# Patient Record
Sex: Male | Born: 1975 | Race: White | Marital: Married | Smoking: Current every day smoker
Health system: Southern US, Community
[De-identification: ages and names within clinical notes are randomized; demographics above are authoritative.]

## PROBLEM LIST (undated history)

## (undated) DIAGNOSIS — E119 Type 2 diabetes mellitus without complications: Secondary | ICD-10-CM

---

## 2020-03-20 ENCOUNTER — Emergency Department: Payer: No Typology Code available for payment source

## 2020-03-20 ENCOUNTER — Emergency Department
Admission: EM | Admit: 2020-03-20 | Discharge: 2020-03-23 | Disposition: E | Payer: No Typology Code available for payment source | Attending: Emergency Medicine | Admitting: Emergency Medicine

## 2020-03-20 ENCOUNTER — Other Ambulatory Visit: Payer: Self-pay

## 2020-03-20 DIAGNOSIS — U071 COVID-19: Secondary | ICD-10-CM | POA: Diagnosis not present

## 2020-03-20 DIAGNOSIS — A419 Sepsis, unspecified organism: Secondary | ICD-10-CM | POA: Insufficient documentation

## 2020-03-20 DIAGNOSIS — E872 Acidosis, unspecified: Secondary | ICD-10-CM

## 2020-03-20 DIAGNOSIS — E119 Type 2 diabetes mellitus without complications: Secondary | ICD-10-CM | POA: Diagnosis not present

## 2020-03-20 DIAGNOSIS — Z7984 Long term (current) use of oral hypoglycemic drugs: Secondary | ICD-10-CM | POA: Diagnosis not present

## 2020-03-20 DIAGNOSIS — J9601 Acute respiratory failure with hypoxia: Secondary | ICD-10-CM

## 2020-03-20 DIAGNOSIS — R6520 Severe sepsis without septic shock: Secondary | ICD-10-CM | POA: Diagnosis not present

## 2020-03-20 DIAGNOSIS — R0602 Shortness of breath: Secondary | ICD-10-CM | POA: Diagnosis present

## 2020-03-20 DIAGNOSIS — R739 Hyperglycemia, unspecified: Secondary | ICD-10-CM

## 2020-03-20 DIAGNOSIS — J9600 Acute respiratory failure, unspecified whether with hypoxia or hypercapnia: Secondary | ICD-10-CM | POA: Diagnosis not present

## 2020-03-20 DIAGNOSIS — I469 Cardiac arrest, cause unspecified: Secondary | ICD-10-CM | POA: Diagnosis not present

## 2020-03-20 DIAGNOSIS — F17200 Nicotine dependence, unspecified, uncomplicated: Secondary | ICD-10-CM | POA: Diagnosis not present

## 2020-03-20 HISTORY — DX: Type 2 diabetes mellitus without complications: E11.9

## 2020-03-20 LAB — BLOOD GAS, VENOUS
Acid-base deficit: 5.5 mmol/L — ABNORMAL HIGH (ref 0.0–2.0)
Bicarbonate: 18 mmol/L — ABNORMAL LOW (ref 20.0–28.0)
O2 Saturation: 99.6 %
Patient temperature: 37
pCO2, Ven: 29 mmHg — ABNORMAL LOW (ref 44.0–60.0)
pH, Ven: 7.4 (ref 7.250–7.430)
pO2, Ven: 176 mmHg — ABNORMAL HIGH (ref 32.0–45.0)

## 2020-03-20 LAB — BRAIN NATRIURETIC PEPTIDE: B Natriuretic Peptide: 94.6 pg/mL (ref 0.0–100.0)

## 2020-03-20 LAB — COMPREHENSIVE METABOLIC PANEL
ALT: 61 U/L — ABNORMAL HIGH (ref 0–44)
AST: 65 U/L — ABNORMAL HIGH (ref 15–41)
Albumin: 3.3 g/dL — ABNORMAL LOW (ref 3.5–5.0)
Alkaline Phosphatase: 136 U/L — ABNORMAL HIGH (ref 38–126)
Anion gap: 25 — ABNORMAL HIGH (ref 5–15)
BUN: 23 mg/dL — ABNORMAL HIGH (ref 6–20)
CO2: 9 mmol/L — ABNORMAL LOW (ref 22–32)
Calcium: 9.1 mg/dL (ref 8.9–10.3)
Chloride: 103 mmol/L (ref 98–111)
Creatinine, Ser: 1.26 mg/dL — ABNORMAL HIGH (ref 0.61–1.24)
GFR calc Af Amer: >60 mL/min (ref >60)
GFR calc non Af Amer: >60 mL/min (ref >60)
Glucose, Bld: 336 mg/dL — ABNORMAL HIGH (ref 70–99)
Potassium: 3.6 mmol/L (ref 3.5–5.1)
Sodium: 137 mmol/L (ref 135–145)
Total Bilirubin: 1.9 mg/dL — ABNORMAL HIGH (ref 0.3–1.2)
Total Protein: 8.6 g/dL — ABNORMAL HIGH (ref 6.5–8.1)

## 2020-03-20 LAB — CBC WITH DIFFERENTIAL/PLATELET
Abs Immature Granulocytes: 0.25 10*3/uL — ABNORMAL HIGH (ref 0.00–0.07)
Basophils Absolute: 0.1 10*3/uL (ref 0.0–0.1)
Basophils Relative: 0 %
Eosinophils Absolute: 0 10*3/uL (ref 0.0–0.5)
Eosinophils Relative: 0 %
HCT: 52.9 % — ABNORMAL HIGH (ref 39.0–52.0)
Hemoglobin: 17.9 g/dL — ABNORMAL HIGH (ref 13.0–17.0)
Immature Granulocytes: 1 %
Lymphocytes Relative: 9 %
Lymphs Abs: 2 10*3/uL (ref 0.7–4.0)
MCH: 30.9 pg (ref 26.0–34.0)
MCHC: 33.8 g/dL (ref 30.0–36.0)
MCV: 91.4 fL (ref 80.0–100.0)
Monocytes Absolute: 1.1 10*3/uL — ABNORMAL HIGH (ref 0.1–1.0)
Monocytes Relative: 5 %
Neutro Abs: 18.2 10*3/uL — ABNORMAL HIGH (ref 1.7–7.7)
Neutrophils Relative %: 85 %
Platelets: 201 10*3/uL (ref 150–400)
RBC: 5.79 MIL/uL (ref 4.22–5.81)
RDW: 12 % (ref 11.5–15.5)
Smear Review: NORMAL
WBC: 21.6 10*3/uL — ABNORMAL HIGH (ref 4.0–10.5)
nRBC: 0 % (ref 0.0–0.2)

## 2020-03-20 LAB — MAGNESIUM: Magnesium: 2.7 mg/dL — ABNORMAL HIGH (ref 1.7–2.4)

## 2020-03-20 LAB — GLUCOSE, CAPILLARY: Glucose-Capillary: 220 mg/dL — ABNORMAL HIGH (ref 70–99)

## 2020-03-20 LAB — TROPONIN I (HIGH SENSITIVITY): Troponin I (High Sensitivity): 19 ng/L — ABNORMAL HIGH (ref <18)

## 2020-03-20 LAB — LACTIC ACID, PLASMA: Lactic Acid, Venous: 5.7 mmol/L (ref 0.5–1.9)

## 2020-03-20 MED ORDER — LACTATED RINGERS IV SOLN: INTRAVENOUS | Status: DC

## 2020-03-20 MED ORDER — LORAZEPAM 2 MG/ML IJ SOLN
INTRAMUSCULAR | Status: AC
  Administered 2020-03-20: 0.5 mg via INTRAVENOUS
  Filled 2020-03-20: qty 1

## 2020-03-20 MED ORDER — EPINEPHRINE 1 MG/10ML IJ SOSY
PREFILLED_SYRINGE | INTRAMUSCULAR | Status: AC | PRN
  Administered 2020-03-20 (×6): 1 via INTRAVENOUS

## 2020-03-20 MED ORDER — SODIUM BICARBONATE 8.4 % IV SOLN
INTRAVENOUS | Status: AC | PRN
  Administered 2020-03-20 (×4): 50 meq via INTRAVENOUS

## 2020-03-20 MED ORDER — DEXAMETHASONE SODIUM PHOSPHATE 10 MG/ML IJ SOLN
6.0000 mg | Freq: Once | INTRAMUSCULAR | Status: AC
  Administered 2020-03-20: 6 mg via INTRAVENOUS
  Filled 2020-03-20: qty 1

## 2020-03-20 MED ORDER — IOHEXOL 350 MG/ML SOLN
100.0000 mL | Freq: Once | INTRAVENOUS | Status: AC | PRN
  Administered 2020-03-20: 100 mL via INTRAVENOUS

## 2020-03-20 MED ORDER — LORAZEPAM 2 MG/ML IJ SOLN: 0.5000 mg | Freq: Once | INTRAMUSCULAR | Status: AC

## 2020-03-20 MED ORDER — INSULIN REGULAR(HUMAN) IN NACL 100-0.9 UT/100ML-% IV SOLN: INTRAVENOUS | Status: DC

## 2020-03-20 MED ORDER — ETOMIDATE 2 MG/ML IV SOLN
INTRAVENOUS | Status: AC | PRN
  Administered 2020-03-20: 30 mg via INTRAVENOUS

## 2020-03-20 MED ORDER — LACTATED RINGERS IV BOLUS: 1000.0000 mL | Freq: Once | INTRAVENOUS | Status: DC

## 2020-03-20 MED ORDER — DEXTROSE IN LACTATED RINGERS 5 % IV SOLN: INTRAVENOUS | Status: DC

## 2020-03-20 MED ORDER — LACTATED RINGERS IV BOLUS
1000.0000 mL | Freq: Once | INTRAVENOUS | Status: AC
  Administered 2020-03-20: 1000 mL via INTRAVENOUS

## 2020-03-20 MED ORDER — ROCURONIUM BROMIDE 50 MG/5ML IV SOLN
INTRAVENOUS | Status: AC | PRN
  Administered 2020-03-20: 100 mg via INTRAVENOUS

## 2020-03-20 MED ORDER — SODIUM CHLORIDE 0.9 % IV SOLN
100.0000 mg | Freq: Every day | INTRAVENOUS | Status: DC
  Filled 2020-03-20: qty 20

## 2020-03-20 MED ORDER — SODIUM BICARBONATE 8.4 % IV SOLN: INTRAVENOUS | Status: DC

## 2020-03-20 MED ORDER — DEXTROSE 50 % IV SOLN
0.0000 mL | INTRAVENOUS | Status: DC | PRN
  Filled 2020-03-20: qty 50

## 2020-03-20 MED ORDER — INSULIN ASPART 100 UNIT/ML ~~LOC~~ SOLN: 0.0000 [IU] | SUBCUTANEOUS | Status: DC

## 2020-03-20 MED ORDER — SODIUM CHLORIDE 0.9 % IV SOLN
1.0000 g | Freq: Once | INTRAVENOUS | Status: AC
  Administered 2020-03-20: 1 g via INTRAVENOUS
  Filled 2020-03-20: qty 10

## 2020-03-20 MED ORDER — SODIUM BICARBONATE-DEXTROSE 150-5 MEQ/L-% IV SOLN
150.0000 meq | INTRAVENOUS | Status: DC
  Administered 2020-03-20: 150 meq via INTRAVENOUS
  Filled 2020-03-20 (×4): qty 1000

## 2020-03-20 MED ORDER — POTASSIUM CHLORIDE 10 MEQ/100ML IV SOLN
10.0000 meq | INTRAVENOUS | Status: AC
  Filled 2020-03-20: qty 100

## 2020-03-20 MED ORDER — SODIUM CHLORIDE 0.9 % IV SOLN
200.0000 mg | Freq: Once | INTRAVENOUS | Status: DC
  Filled 2020-03-20: qty 40

## 2020-03-20 MED ORDER — SODIUM CHLORIDE 0.9 % IV SOLN
500.0000 mg | Freq: Once | INTRAVENOUS | Status: AC
  Administered 2020-03-20: 500 mg via INTRAVENOUS
  Filled 2020-03-20: qty 500

## 2020-03-20 MED ORDER — DEXMEDETOMIDINE HCL IN NACL 400 MCG/100ML IV SOLN
0.4000 ug/kg/h | INTRAVENOUS | Status: DC
  Filled 2020-03-20: qty 100

## 2020-03-20 MED ORDER — ATROPINE SULFATE 1 MG/ML IJ SOLN
INTRAMUSCULAR | Status: AC | PRN
  Administered 2020-03-20: 1 mg via INTRAVENOUS

## 2020-03-20 MED ORDER — SODIUM BICARBONATE 8.4 % IV SOLN
INTRAVENOUS | Status: DC
  Filled 2020-03-20: qty 850

## 2020-03-20 MED ORDER — CALCIUM CHLORIDE 10 % IV SOLN
INTRAVENOUS | Status: AC | PRN
  Administered 2020-03-20: 1 g via INTRAVENOUS

## 2020-03-21 LAB — BLOOD GAS, VENOUS
Acid-base deficit: 26.3 mmol/L — ABNORMAL HIGH (ref 0.0–2.0)
Bicarbonate: 8.7 mmol/L — ABNORMAL LOW (ref 20.0–28.0)
O2 Saturation: 19.8 %
Patient temperature: 37
pCO2, Ven: 52 mmHg (ref 44.0–60.0)
pH, Ven: <6.9 — CL (ref 7.250–7.430)

## 2020-03-23 MED FILL — Medication: Qty: 1 | Status: AC

## 2020-03-23 NOTE — Code Documentation (Signed)
Pulse check; no pulses present 

## 2020-03-23 NOTE — Code Documentation (Signed)
Pulse check; no pulses present

## 2020-03-23 NOTE — Code Documentation (Signed)
Wife was called and informed of critical condition, told to come to the hospital to speak with the doctor

## 2020-03-23 NOTE — ED Provider Notes (Addendum)
[facility] Emergency Department Provider Note  ____________________________________________   First MD Initiated Contact with Patient 2020-04-24 1151     (approximate)  I have reviewed the triage vital signs and the nursing notes.   HISTORY  Chief Complaint COVID+ and Cardiac Arrest   HPI Troy Fritz is a 44 y.o. male with a past medical history of diabetes who presents for assessment of worsening cough and shortness of breath after he was recently diagnosed with COVID-19 on 8/22.  History is very limited from the patient secondary to severe respiratory distress.  Patient endorses poor p.o. intake and some myalgias but denies any current chest pain or productive cough.  No focal pain, rash, or other acute physical symptoms.  Patient is not a smoker and does not wear oxygen at baseline.  Unable to get an additional history secondary to respiratory distress and patient immediately placed on BiPAP on arrival.         Past Medical History:  Diagnosis Date  . Diabetes mellitus without complication (HCC)     There are no problems to display for this patient.   History reviewed. No pertinent surgical history.  Prior to Admission medications   Medication Sig Start Date End Date Taking? Authorizing Provider  metFORMIN (GLUCOPHAGE) 1000 MG tablet Take 1,000 mg by mouth 2 (two) times daily with a meal. 03/08/16  Yes [provider]    Allergies Patient has no known allergies.  No family history on file.  Social History Social History   Tobacco Use  . Smoking status: Current Every Day Smoker  . Smokeless tobacco: Never Used  Substance Use Topics  . Alcohol use: Never  . Drug use: Never    Review of Systems  Review of Systems  Unable to perform ROS: Severe respiratory distress  Respiratory: Positive for shortness of breath.   Musculoskeletal: Positive for myalgias.      ____________________________________________   PHYSICAL  EXAM:  VITAL SIGNS: ED Triage Vitals  Enc Vitals Group     BP 2020-04-24 1131 (!) 152/90     Pulse Rate 2020-04-24 1131 (!) 149     Resp 2020-04-24 1131 (!) 26     Temp --      Temp src --      SpO2 2020-04-24 1131 (!) 79 %     Weight 2020-04-24 1129 240 lb (108.9 kg)     Height 2020-04-24 1129 5\' 11"  (1.803 m)     Head Circumference --      Peak Flow --      Pain Score 2020-04-24 1129 0     Pain Loc --      Pain Edu? --      Excl. in GC? --    Vitals:   2020-04-24 1330 2020-04-24 1400  BP: (!) 145/103 (!) 213/148  Pulse: (!) 145 (!) 143  Resp: (!) 65 (!) 42  SpO2: 97% 99%   Physical Exam Vitals and nursing note reviewed.  Constitutional:      General: He is in acute distress.     Appearance: He is well-developed. He is ill-appearing and toxic-appearing.  HENT:     Head: Normocephalic and atraumatic.     Right Ear: External ear normal.     Left Ear: External ear normal.     Nose: Nose normal.     Mouth/Throat:     Mouth: Mucous membranes are dry.  Eyes:     Conjunctiva/sclera: Conjunctivae normal.  Cardiovascular:     Rate  and Rhythm: Regular rhythm. Tachycardia present.     Heart sounds: No murmur heard.   Pulmonary:     Effort: Tachypnea, accessory muscle usage and respiratory distress present.     Breath sounds: Rhonchi present.  Abdominal:     Palpations: Abdomen is soft.     Tenderness: There is no abdominal tenderness.  Musculoskeletal:     Cervical back: Neck supple.     Right lower leg: No edema.     Left lower leg: No edema.  Skin:    General: Skin is warm and dry.     Capillary Refill: Capillary refill takes more than 3 seconds.  Neurological:     General: No focal deficit present.     Mental Status: He is lethargic.   He is able to give this examiner a thumbs up with significant verbal stimulation.  ____________________________________________   LABS (all labs ordered are listed, but only abnormal results are displayed)  Labs Reviewed  CBC WITH  DIFFERENTIAL/PLATELET - Abnormal; Notable for the following components:      Result Value   WBC 21.6 (*)    Hemoglobin 17.9 (*)    HCT 52.9 (*)    Neutro Abs 18.2 (*)    Monocytes Absolute 1.1 (*)    Abs Immature Granulocytes 0.25 (*)    All other components within normal limits  COMPREHENSIVE METABOLIC PANEL - Abnormal; Notable for the following components:   CO2 9 (*)    Glucose, Bld 336 (*)    BUN 23 (*)    Creatinine, Ser 1.26 (*)    Total Protein 8.6 (*)    Albumin 3.3 (*)    AST 65 (*)    ALT 61 (*)    Alkaline Phosphatase 136 (*)    Total Bilirubin 1.9 (*)    Anion gap 25 (*)    All other components within normal limits  MAGNESIUM - Abnormal; Notable for the following components:   Magnesium 2.7 (*)    All other components within normal limits  BLOOD GAS, VENOUS - Abnormal; Notable for the following components:   pH, Ven <6.900 (*)    Bicarbonate 8.7 (*)    Acid-base deficit 26.3 (*)    All other components within normal limits  LACTIC ACID, PLASMA - Abnormal; Notable for the following components:   Lactic Acid, Venous 5.7 (*)    All other components within normal limits  BLOOD GAS, VENOUS - Abnormal; Notable for the following components:   pCO2, Ven 29 (*)    pO2, Ven 176.0 (*)    Bicarbonate 18.0 (*)    Acid-base deficit 5.5 (*)    All other components within normal limits  GLUCOSE, CAPILLARY - Abnormal; Notable for the following components:   Glucose-Capillary 220 (*)    All other components within normal limits  TROPONIN I (HIGH SENSITIVITY) - Abnormal; Notable for the following components:   Troponin I (High Sensitivity) 19 (*)    All other components within normal limits  CULTURE, BLOOD (SINGLE)  URINE CULTURE  CULTURE, BLOOD (ROUTINE X 2)  CULTURE, BLOOD (ROUTINE X 2)  BRAIN NATRIURETIC PEPTIDE  LACTIC ACID, PLASMA  PROTIME-INR  APTT  URINALYSIS, COMPLETE (UACMP) WITH MICROSCOPIC  HEMOGLOBIN A1C  BETA-HYDROXYBUTYRIC ACID  BASIC METABOLIC PANEL    BASIC METABOLIC PANEL  BASIC METABOLIC PANEL  BASIC METABOLIC PANEL  BASIC METABOLIC PANEL  CBG MONITORING, ED  TROPONIN I (HIGH SENSITIVITY)   ____________________________________________  EKG  Sinus tachycardia with a ventricular rate of 147,  right axis deviation, diffuse ST and T wave changes, QTc interval of 538, and otherwise unremarkable intervals. ____________________________________________  RADIOLOGY  ED MD interpretation: Bilateral pneumonia.  No pneumothorax.  No significant effusion.  Official radiology report(s): DG Chest 1 View  Result Date: 04/01/2020 CLINICAL DATA:  Shortness of breath, COVID positive EXAM: CHEST  1 VIEW COMPARISON:  None. FINDINGS: The heart is normal in size accounting for technique. Moderate to severe patchy bilateral airspace opacities are noted. There is no pleural effusion or pneumothorax. The osseous structures are intact. IMPRESSION: Moderate to severe patchy bilateral airspace opacities, consistent with COVID-19 pneumonia. Electronically Signed   By: Romona Curls M.D.   On: Apr 01, 2020 12:57   CT Angio Chest PE W and/or Wo Contrast  Result Date: 2020/04/01 CLINICAL DATA:  Short of breath and hypoxemia. COVID positive. Rule out pulmonary embolism. EXAM: CT ANGIOGRAPHY CHEST WITH CONTRAST TECHNIQUE: Multidetector CT imaging of the chest was performed using the standard protocol during bolus administration of intravenous contrast. Multiplanar CT image reconstructions and MIPs were obtained to evaluate the vascular anatomy. CONTRAST:  OMNIPAQUE IOHEXOL 350 MG/ML SOLN COMPARISON:  Portable chest 2020/04/01 FINDINGS: Cardiovascular: Exam quality degraded by motion. Patient not able to hold his breath. Heart size upper normal. No pericardial effusion. Pulmonary arteries normal in caliber. No proximal pulmonary emboli. Distal pulmonary emboli could be overlooked due to motion. Normal thoracic aorta. Mediastinum/Nodes: Negative for mass or adenopathy.  Lungs/Pleura: Severe diffuse bilateral airspace disease. This is present bilaterally and slightly more prominent on the right upper lobe. No pleural effusion. Upper Abdomen: Calcified gallstones without gallbladder wall thickening. Musculoskeletal: No acute skeletal abnormality. Thoracic degenerative change in spurring. Review of the MIP images confirms the above findings. IMPRESSION: 1. Negative for pulmonary embolism. Small emboli could be missed on the study due to extensive motion. 2. Severe diffuse bilateral airspace disease most compatible with COVID pneumonia. 3. Cholelithiasis Electronically Signed   By: Marlan Palau M.D.   On: 04-01-20 13:35    ____________________________________________   PROCEDURES  Procedure(s) performed (including Critical Care):  Procedure Name: Intubation Date/Time: Apr 01, 2020 3:48 PM Performed by: Gilles Chiquito, MD Pre-anesthesia Checklist: Patient identified, Emergency Drugs available, Suction available and Patient being monitored Oxygen Delivery Method: Non-rebreather mask Preoxygenation: Pre-oxygenation with 100% oxygen Induction Type: Rapid sequence Laryngoscope Size: Glidescope Grade View: Grade I Tube size: 7.5 mm Number of attempts: 2 Airway Equipment and Method: Bougie stylet Placement Confirmation: ETT inserted through vocal cords under direct vision,  Positive ETCO2,  CO2 detector and Breath sounds checked- equal and bilateral    .Critical Care Performed by: Gilles Chiquito, MD Authorized by: Gilles Chiquito, MD   Critical care provider statement:    Critical care time (minutes):  180   Critical care time was exclusive of:  Separately billable procedures and treating other patients   Critical care was necessary to treat or prevent imminent or life-threatening deterioration of the following conditions:  Respiratory failure   Critical care was time spent personally by me on the following activities:  Discussions with consultants,  evaluation of patient's response to treatment, examination of patient, ordering and performing treatments and interventions, ordering and review of laboratory studies, ordering and review of radiographic studies, pulse oximetry, re-evaluation of patient's condition, obtaining history from patient or surrogate and review of old charts     ____________________________________________   INITIAL IMPRESSION / ASSESSMENT AND PLAN / ED COURSE        On arrival patient is noted to be  in severe respiratory distress.  He was placed on nonrebreather in triage and transitioned to BiPAP after being brought back to the ED resuscitation area.  He is known to be hypertensive, tachycardic, tachypneic, borderline O2 sats on BiPAP.  Overall patient's initial history, exam, and work-up and specifically initial VBG is concerning for severe acidosis multifactorial primarily driven by metabolic acidosis with respiratory acidosis superimposed.  CTA chest obtained shows no evidence of PE but does show severe bilateral airspace disease.  Given elevated white blood cell count and lactic acidosis which are likely secondary to Covid infection patient was also made code sepsis and blood culture sent antibiotics covering pneumonia were given.  In addition IV fluids including a bicarb drip were initiated.  Lactic acidosis is likely driven patient's metabolic acidosis we will also send blood hydroxybutyrate given patient is a diabetic to assess for evidence of DKA.  On reassessment patient did have worsening of his mental status and decision made to intubate as patient appeared to be failing to compensate his metabolic acidosis.  Patient no longer able to follow commands on final reassessment prior intubation.  Patient intubated per procedure note above.  Shortly after being intubated patient was noted to become bradycardic and pulseless.  Patient was coded per ACLS protocol for approximately 40 minutes.  During entire  resuscitation attempt patient was in PEA rhythm.  Please see nursing documentation for full details regarding administration of medications.  In brief at approximately 40 minutes into the resuscitation I determined that further resuscitation attempts were futile and called patient's time of death at 3:20 PM.  Cardiac standstill confirmed on ultrasound.  Medications  remdesivir 200 mg in sodium chloride 0.9% 250 mL IVPB (has no administration in time range)    Followed by  remdesivir 100 mg in sodium chloride 0.9 % 100 mL IVPB (has no administration in time range)  dexmedetomidine (PRECEDEX) 400 MCG/100ML (4 mcg/mL) infusion (has no administration in time range)  insulin regular, human (MYXREDLIN) 100 units/ 100 mL infusion (has no administration in time range)  dextrose 5 % in lactated ringers infusion (has no administration in time range)  dextrose 50 % solution 0-50 mL (has no administration in time range)  potassium chloride 10 mEq in 100 mL IVPB (has no administration in time range)  lactated ringers infusion (has no administration in time range)  sodium bicarbonate 150 mEq in dextrose 5% 1000 mL infusion (150 mEq Intravenous New Bag/Given 2020-04-15 1441)  dexamethasone (DECADRON) injection 6 mg (6 mg Intravenous Given 04/15/2020 1226)  iohexol (OMNIPAQUE) 350 MG/ML injection 100 mL (100 mLs Intravenous Contrast Given 2020/04/15 1248)  cefTRIAXone (ROCEPHIN) 1 g in sodium chloride 0.9 % 100 mL IVPB (0 g Intravenous Stopped 2020/04/15 1402)  azithromycin (ZITHROMAX) 500 mg in sodium chloride 0.9 % 250 mL IVPB (500 mg Intravenous New Bag/Given 04-15-2020 1359)  LORazepam (ATIVAN) injection 0.5 mg (0.5 mg Intravenous Given 15-Apr-2020 1222)  lactated ringers bolus 1,000 mL (1,000 mLs Intravenous New Bag/Given Apr 15, 2020 1336)  etomidate (AMIDATE) injection (30 mg Intravenous Given 04-15-20 1428)  rocuronium (ZEMURON) injection (100 mg Intravenous Given 2020/04/15 1428)  atropine injection (1 mg Intravenous Given  04/15/2020 1435)  EPINEPHrine (ADRENALIN) 1 MG/10ML injection (1 Syringe Intravenous Given 04/15/2020 1515)  sodium bicarbonate injection (50 mEq Intravenous Given Apr 15, 2020 1453)  calcium chloride injection (1 g Intravenous Given 04/15/2020 1449)    ____________________________________________   FINAL CLINICAL IMPRESSION(S) / ED DIAGNOSES  Final diagnoses:  COVID-19  Cardiac arrest (HCC)  Sepsis, due to unspecified organism,  unspecified whether acute organ dysfunction present (HCC)  Lactic acid acidosis  Acute respiratory failure with hypoxia and hypercapnia (HCC)  Hyperglycemia    Medications  remdesivir 200 mg in sodium chloride 0.9% 250 mL IVPB (has no administration in time range)    Followed by  remdesivir 100 mg in sodium chloride 0.9 % 100 mL IVPB (has no administration in time range)  dexmedetomidine (PRECEDEX) 400 MCG/100ML (4 mcg/mL) infusion (has no administration in time range)  insulin regular, human (MYXREDLIN) 100 units/ 100 mL infusion (has no administration in time range)  dextrose 5 % in lactated ringers infusion (has no administration in time range)  dextrose 50 % solution 0-50 mL (has no administration in time range)  potassium chloride 10 mEq in 100 mL IVPB (has no administration in time range)  lactated ringers infusion (has no administration in time range)  sodium bicarbonate 150 mEq in dextrose 5% 1000 mL infusion (150 mEq Intravenous New Bag/Given 17-Apr-2020 1441)  dexamethasone (DECADRON) injection 6 mg (6 mg Intravenous Given 04-17-20 1226)  iohexol (OMNIPAQUE) 350 MG/ML injection 100 mL (100 mLs Intravenous Contrast Given 04-17-20 1248)  cefTRIAXone (ROCEPHIN) 1 g in sodium chloride 0.9 % 100 mL IVPB (0 g Intravenous Stopped 04-17-2020 1402)  azithromycin (ZITHROMAX) 500 mg in sodium chloride 0.9 % 250 mL IVPB (500 mg Intravenous New Bag/Given Apr 17, 2020 1359)  LORazepam (ATIVAN) injection 0.5 mg (0.5 mg Intravenous Given Apr 17, 2020 1222)  lactated ringers bolus 1,000 mL  (1,000 mLs Intravenous New Bag/Given 2020-04-17 1336)  etomidate (AMIDATE) injection (30 mg Intravenous Given Apr 17, 2020 1428)  rocuronium (ZEMURON) injection (100 mg Intravenous Given 04-17-2020 1428)  atropine injection (1 mg Intravenous Given Apr 17, 2020 1435)  EPINEPHrine (ADRENALIN) 1 MG/10ML injection (1 Syringe Intravenous Given 04-17-20 1515)  sodium bicarbonate injection (50 mEq Intravenous Given April 17, 2020 1453)  calcium chloride injection (1 g Intravenous Given 2020-04-17 1449)     ED Discharge Orders    None       Note:  This document was prepared using Dragon voice recognition software and may include unintentional dictation errors.   Gilles Chiquito, MD 2020/04/17 1607    Gilles Chiquito, MD April 17, 2020 2024

## 2020-03-23 NOTE — Progress Notes (Signed)
Chaplain responded to a page request.Chaplain joined MD, [name]'s wife and her son in a consult room. Loved ones were made aware of Troy Fritz death.  Chaplain made space for loved ones to voice their grief. Chaplain acknowledged feelings of shock.  Chaplain escorted loved ones to see and be with deceased Pt, Troy Fritz. Chaplain provided emotional-spiritual care to family by listening to wife share sweet stories about her love for deceased Pt, Troy Fritz.  Chaplain offered to honor Troy Fritz. Troy Fritz asked Chaplain to offer a [place] prayer.  Chaplain consulted with RN. Chaplain escorted loved ones to front entrance after  RN met with them.  Troy Fritz< MDiv Staff Chaplain-Relief

## 2020-03-23 NOTE — Code Documentation (Signed)
[place] pulse check; no pulses present

## 2020-03-23 NOTE — ED Triage Notes (Addendum)
Pt comes via POV from home with c/o COVID+. Pt states he was dx last Sunday. Pt states he feels like he can't breath.  Pt's O2 at 70% RA and HR-152.  Pt denies any CP.  Pt has accessory muscle use and labored breathing noted at this time. Pt placed on 4L [place] with little improvement

## 2020-03-23 NOTE — ED Notes (Signed)
Pt O2 at 78% [place] at 4L, pt increased to 6L at this time. Pt's O2 now at 81%. Will place pt on nonrebreather at 15L.

## 2020-03-23 NOTE — ED Notes (Signed)
full rainbow, lactic and one set of cultures sent

## 2020-03-23 NOTE — Progress Notes (Signed)
Remdesivir - Pharmacy Brief Note   O:  ALT: 61 CXR: Moderate to severe patchy bilateral airspace opacities, consistent with COVID-19 pneumonia. SpO2: 81% on 6L, Placed on nonrebreather at 15L   A/P:  Remdesivir 200 mg IVPB once followed by 100 mg IVPB daily x 4 days.   Gardner Candle, PharmD, BCPS Clinical Pharmacist 04/15/2020 12:13 PM

## 2020-03-23 NOTE — Code Documentation (Signed)
Patient time of death occurred at 1520 by Dr. Katrinka Blazing.

## 2020-03-23 DEATH — deceased

## 2020-03-25 LAB — CULTURE, BLOOD (ROUTINE X 2)
Culture: NO GROWTH
Culture: NO GROWTH
Special Requests: ADEQUATE

## 2021-01-20 ENCOUNTER — Other Ambulatory Visit (HOSPITAL_COMMUNITY): Payer: Self-pay

## 2021-08-04 IMAGING — CT CT ANGIO CHEST
2 of 6 series · 18 of 46 positions shown · IV contrast (APPLIED)
Comparison: Portable chest 03/20/2020

CLINICAL DATA: Short of breath and hypoxemia. COVID positive. Rule
out pulmonary embolism.

EXAM:
CT ANGIOGRAPHY CHEST WITH CONTRAST
TECHNIQUE: Multidetector CT imaging of the chest was performed using the
standard protocol during bolus administration of intravenous
contrast. Multiplanar CT image reconstructions and MIPs were
obtained to evaluate the vascular anatomy.
CONTRAST:  100mL OMNIPAQUE IOHEXOL 350 MG/ML SOLN

[Series 5: thins · axial · 0.71mm/px · z∈[-776,-522]mm · 16 of 280 slices shown]
[im 13/280  lung]
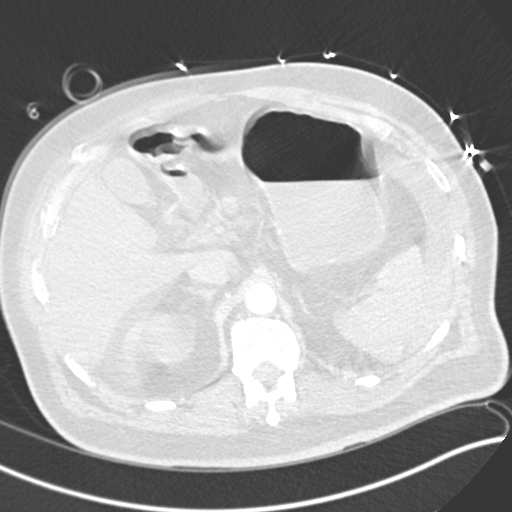
[im 37/280  soft-tissue]
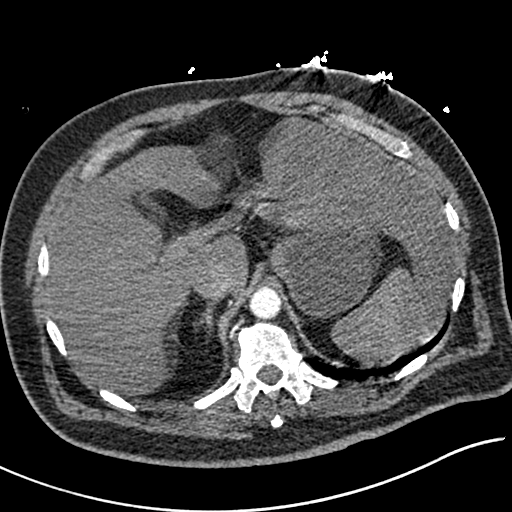
[im 49/280  lung]
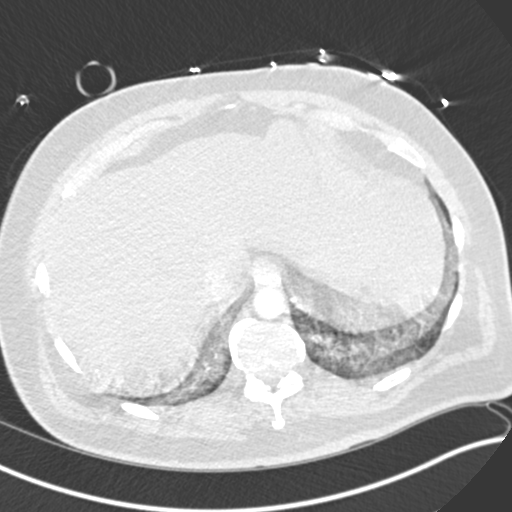
[im 61/280  soft-tissue]
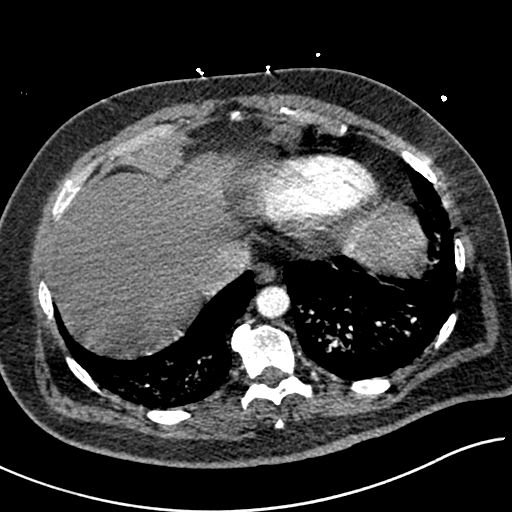
[im 85/280  lung]
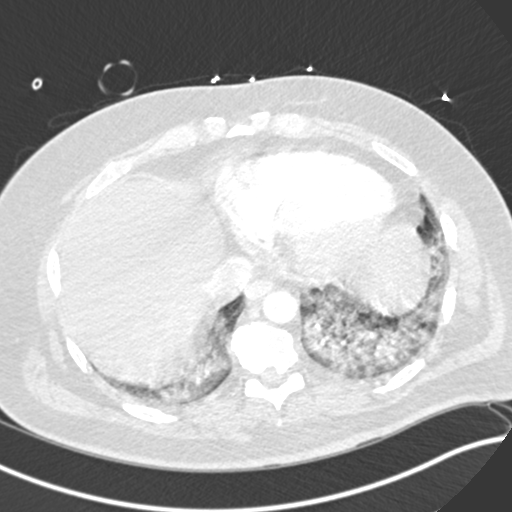
[im 98/280  soft-tissue]
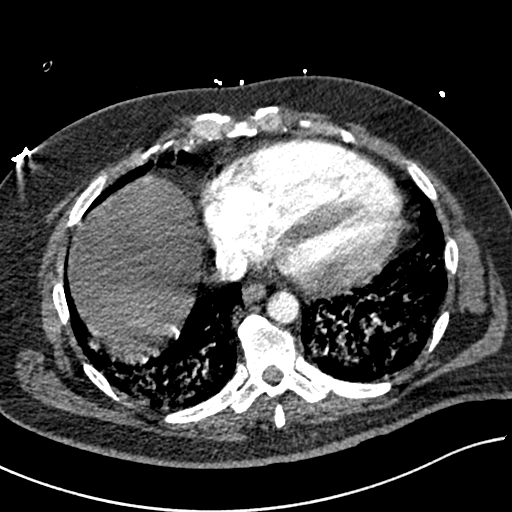
[im 110/280  lung]
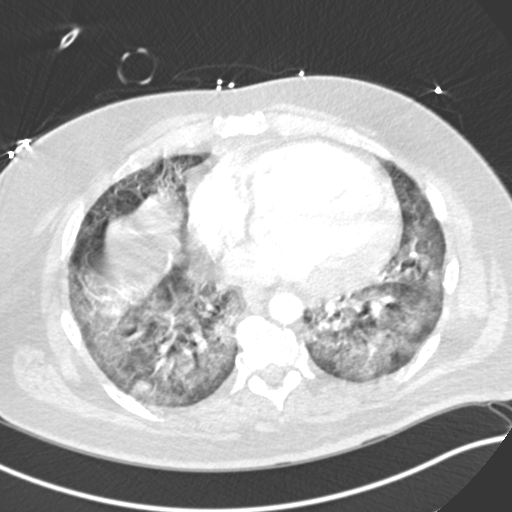
[im 134/280  soft-tissue]
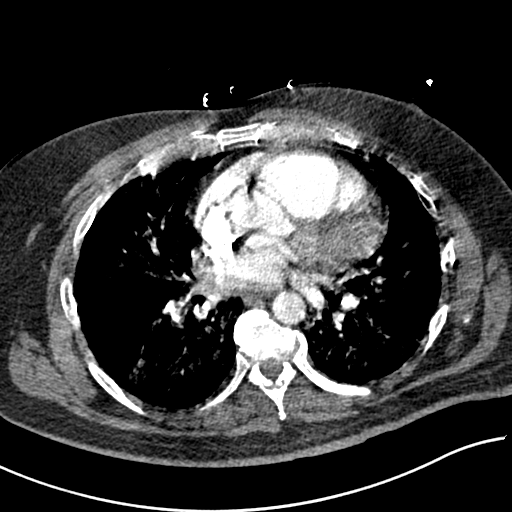
[im 146/280  lung]
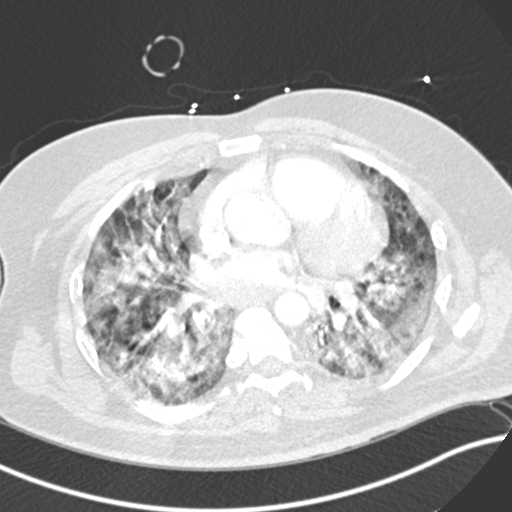
[im 170/280  soft-tissue]
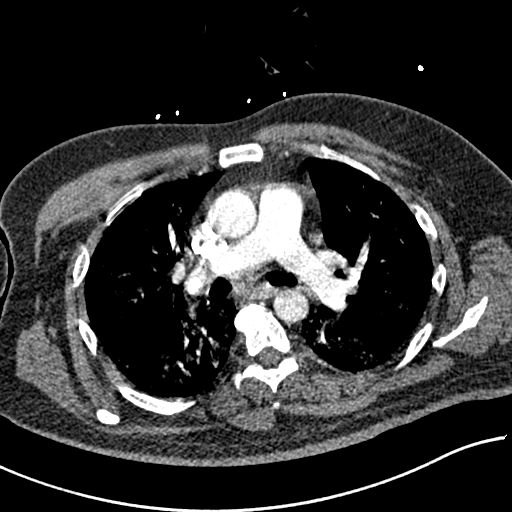
[im 182/280  lung]
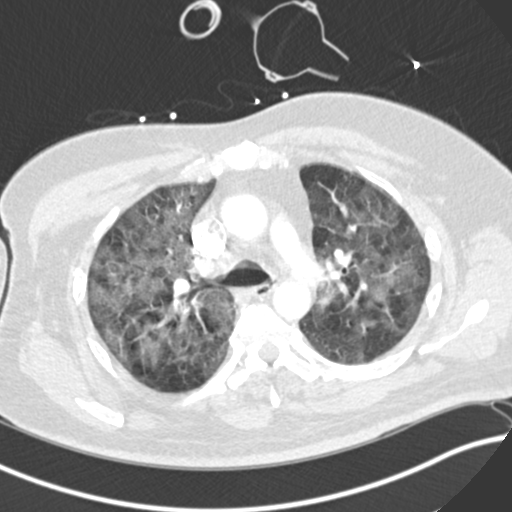
[im 195/280  soft-tissue]
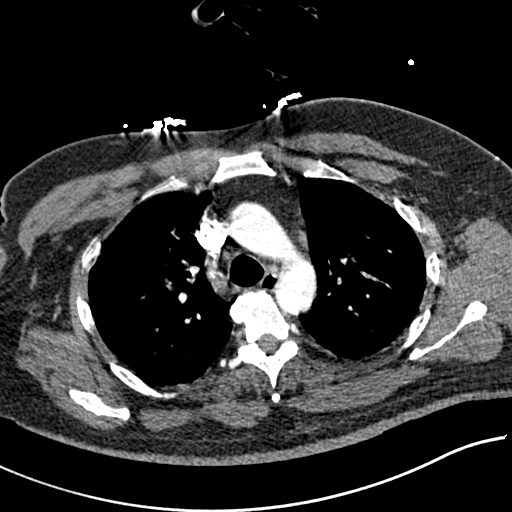
[im 219/280  lung]
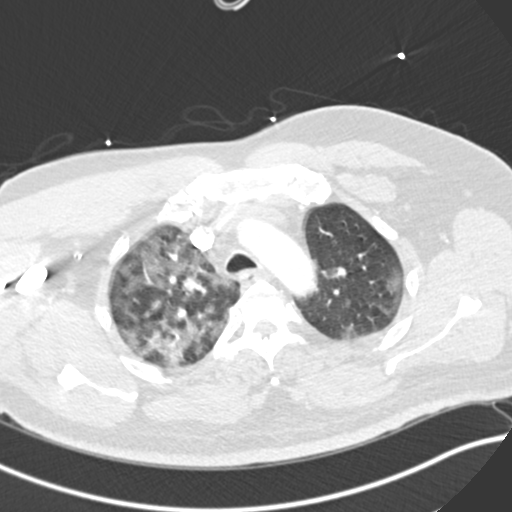
[im 231/280  soft-tissue]
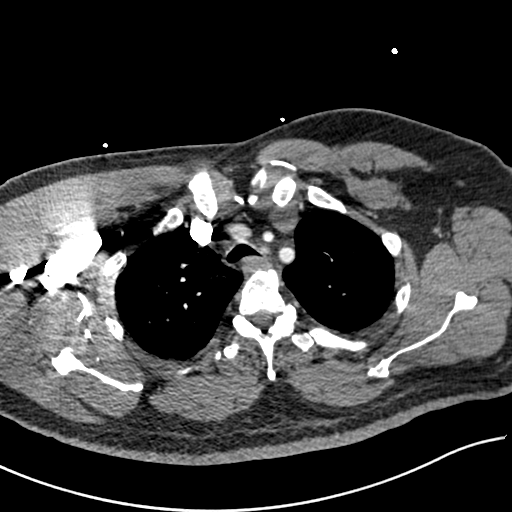
[im 243/280  lung]
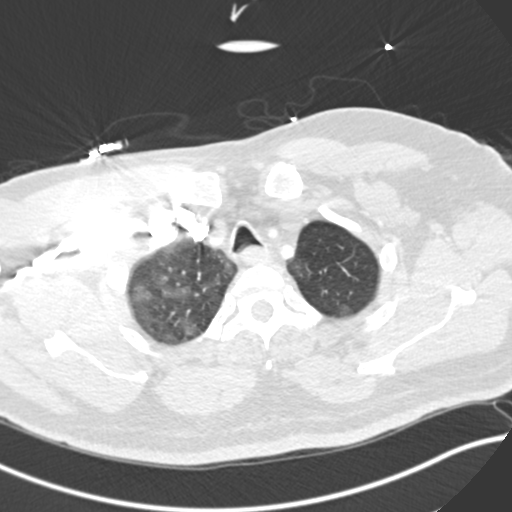
[im 267/280  soft-tissue]
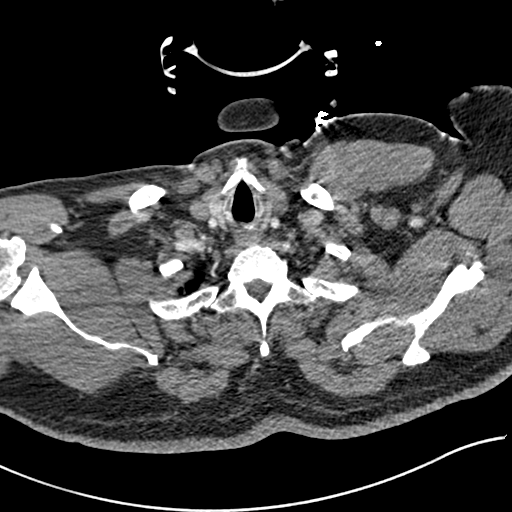

[Series 7: coronal mpr · coronal · 0.57mm/px · 2 of 95 slices shown]
[im 32/95  soft-tissue]
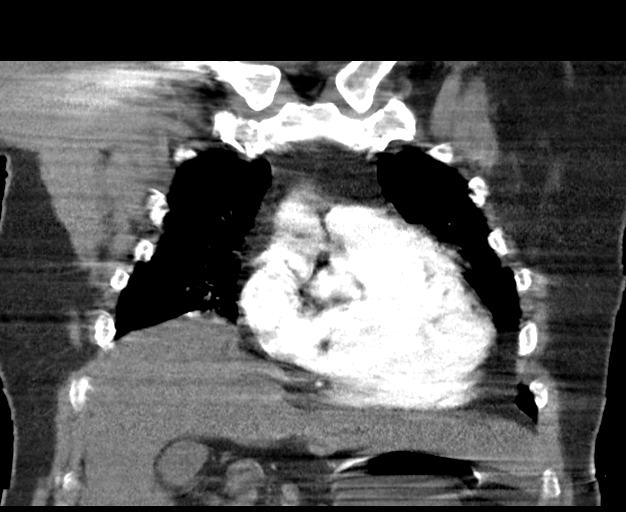
[im 63/95  soft-tissue]
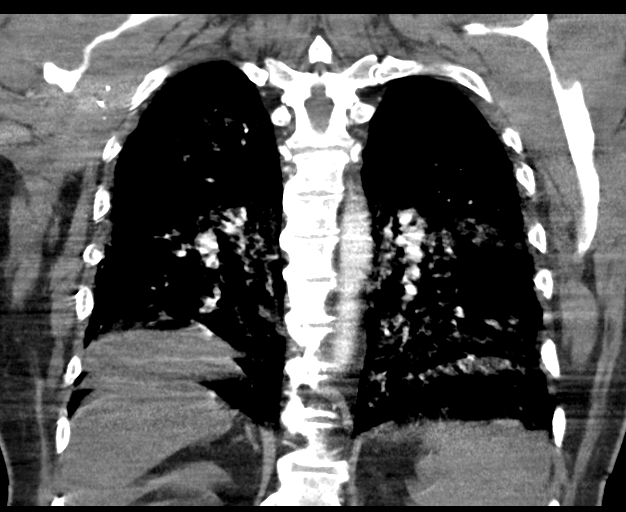

[18 of 46 positions shown; findings below may reference images not displayed]

FINDINGS: Cardiovascular: Exam quality degraded by motion. Patient not able to
hold his breath.

Heart size upper normal. No pericardial effusion. Pulmonary arteries
normal in caliber. No proximal pulmonary emboli. Distal pulmonary
emboli could be overlooked due to motion. Normal thoracic aorta.

Mediastinum/Nodes: Negative for mass or adenopathy.

Lungs/Pleura: Severe diffuse bilateral airspace disease. This is
present bilaterally and slightly more prominent on the right upper
lobe. No pleural effusion.

Upper Abdomen: Calcified gallstones without gallbladder wall
thickening.

Musculoskeletal: No acute skeletal abnormality. Thoracic
degenerative change in spurring.

Review of the MIP images confirms the above findings.
IMPRESSION: 1. Negative for pulmonary embolism. Small emboli could be missed on
the study due to extensive motion.
2. Severe diffuse bilateral airspace disease most compatible with
COVID pneumonia.
3. Cholelithiasis
